# Patient Record
Sex: Female | Born: 2000 | Race: Black or African American | Hispanic: No | Marital: Single | Smoking: Never smoker
Health system: Southern US, Community
[De-identification: ages and names within clinical notes are randomized; demographics above are authoritative.]

---

## 2020-03-20 ENCOUNTER — Emergency Department (HOSPITAL_COMMUNITY): Payer: Medicaid Other

## 2020-03-20 ENCOUNTER — Encounter (HOSPITAL_COMMUNITY): Payer: Self-pay | Admitting: Emergency Medicine

## 2020-03-20 ENCOUNTER — Emergency Department (HOSPITAL_COMMUNITY)
Admission: EM | Admit: 2020-03-20 | Discharge: 2020-03-20 | Disposition: A | Discharge status: Home / Self Care | Payer: Medicaid Other | Attending: Emergency Medicine | Admitting: Emergency Medicine

## 2020-03-20 ENCOUNTER — Other Ambulatory Visit: Payer: Self-pay

## 2020-03-20 DIAGNOSIS — R110 Nausea: Secondary | ICD-10-CM | POA: Diagnosis not present

## 2020-03-20 DIAGNOSIS — R002 Palpitations: Secondary | ICD-10-CM | POA: Diagnosis not present

## 2020-03-20 DIAGNOSIS — F419 Anxiety disorder, unspecified: Secondary | ICD-10-CM | POA: Diagnosis present

## 2020-03-20 LAB — CBC WITH DIFFERENTIAL/PLATELET
Abs Immature Granulocytes: 0.02 10*3/uL (ref 0.00–0.07)
Basophils Absolute: 0 10*3/uL (ref 0.0–0.1)
Basophils Relative: 0 %
Eosinophils Absolute: 0.1 10*3/uL (ref 0.0–0.5)
Eosinophils Relative: 1 %
HCT: 35.7 % — ABNORMAL LOW (ref 36.0–46.0)
Hemoglobin: 12.5 g/dL (ref 12.0–15.0)
Immature Granulocytes: 0 %
Lymphocytes Relative: 23 %
Lymphs Abs: 2.1 10*3/uL (ref 0.7–4.0)
MCH: 29.1 pg (ref 26.0–34.0)
MCHC: 35 g/dL (ref 30.0–36.0)
MCV: 83 fL (ref 80.0–100.0)
Monocytes Absolute: 0.5 10*3/uL (ref 0.1–1.0)
Monocytes Relative: 5 %
Neutro Abs: 6.4 10*3/uL (ref 1.7–7.7)
Neutrophils Relative %: 71 %
Platelets: 269 10*3/uL (ref 150–400)
RBC: 4.3 MIL/uL (ref 3.87–5.11)
RDW: 12.4 % (ref 11.5–15.5)
WBC: 9.1 10*3/uL (ref 4.0–10.5)
nRBC: 0 % (ref 0.0–0.2)

## 2020-03-20 LAB — BASIC METABOLIC PANEL
Anion gap: 8 (ref 5–15)
BUN: 9 mg/dL (ref 6–20)
CO2: 23 mmol/L (ref 22–32)
Calcium: 9.1 mg/dL (ref 8.9–10.3)
Chloride: 104 mmol/L (ref 98–111)
Creatinine, Ser: 0.77 mg/dL (ref 0.44–1.00)
GFR, Estimated: >60 mL/min (ref >60)
Glucose, Bld: 110 mg/dL — ABNORMAL HIGH (ref 70–99)
Potassium: 3.6 mmol/L (ref 3.5–5.1)
Sodium: 135 mmol/L (ref 135–145)

## 2020-03-20 LAB — I-STAT BETA HCG BLOOD, ED (MC, WL, AP ONLY): I-stat hCG, quantitative: <5 m[IU]/mL (ref <5)

## 2020-03-20 MED ORDER — ONDANSETRON 4 MG PO TBDP: 4.0000 mg | ORAL_TABLET | Freq: Three times a day (TID) | ORAL | 0 refills | Status: AC | PRN

## 2020-03-20 MED ORDER — SODIUM CHLORIDE 0.9 % IV BOLUS
1000.0000 mL | Freq: Once | INTRAVENOUS | Status: AC
  Administered 2020-03-20: 1000 mL via INTRAVENOUS

## 2020-03-20 MED ORDER — ONDANSETRON HCL 4 MG/2ML IJ SOLN
4.0000 mg | Freq: Once | INTRAMUSCULAR | Status: AC
  Administered 2020-03-20: 4 mg via INTRAVENOUS
  Filled 2020-03-20: qty 2

## 2020-03-20 NOTE — ED Provider Notes (Signed)
[facility]-EMERGENCY DEPT Provider Note   CSN: [id number] Arrival date & time: 03/20/20  3086    History Chief Complaint  Patient presents with  . Anxiety  . Palpitations    Brittney Henry is a 19 y.o. female with no significant past medical history who presents for evaluation of anxiety and nausea.  Patient states multiple times in the middle of the night she has woken up with nausea,dry heaving and palpitations.  States when this occurs her palpitations will only last a few minutes when self resolve.  Patient states today she has had persistent nausea and dry heaving.  No associated abdominal pain. No NSAID use, EtOh Patient denies any family history of sudden cardiac death, arrhythmia, unexplained syncope.  Patient states she has not had any emesis however "I feel like I am going to throw up."  She denies any headache, lightheadedness, dizziness, chest pain, shortness of breath, cough, Covid exposure, abdominal pain, dysuria, diarrhea, constipation, pelvic pain, vaginal discharge, unilateral leg swelling, redness or warmth.  No recent surgery, mobilization.  No prior history of PE or DVT.  Patient states palpitations today lasted approximately 45 minutes and self resolved prior to EMS arrival.  She denies any illicit drug use, alcohol use.  Denies additional aggravating or alleviating factors.  Seen twice in 2020 and thought symptoms related to anxiety vs transient arrhythmia.  History obtained from patient and past medical records.  No interpreter used.  HPI     History reviewed. No pertinent past medical history.  There are no problems to display for this patient.   History reviewed   OB History   No obstetric history on file.     History reviewed. No pertinent family history.  Social History   Tobacco Use  . Smoking status: Never Smoker  . Smokeless tobacco: Never Used  Vaping Use  . Vaping Use: Never used  Substance Use Topics  . Alcohol use:  Never  . Drug use: Never    Home Medications Prior to Admission medications   Medication Sig Start Date End Date Taking? Authorizing Provider  ZAFEMY 150-35 MCG/24HR transdermal patch Place 1 patch onto the skin once a week.  03/03/20  Yes [provider]    Allergies    Patient has no known allergies.  Review of Systems   Review of Systems  Constitutional: Negative.   HENT: Negative.   Respiratory: Negative.   Cardiovascular: Positive for palpitations. Negative for chest pain and leg swelling.  Gastrointestinal: Positive for nausea. Negative for abdominal distention, abdominal pain, anal bleeding, blood in stool, constipation, diarrhea, rectal pain and vomiting.  Genitourinary: Negative.   Musculoskeletal: Negative.   Skin: Negative.   Neurological: Negative.   All other systems reviewed and are negative.   Physical Exam Updated Vital Signs BP 113/74   Pulse (!) 103   Temp 98.5 F (36.9 C) (Oral)   Resp 16   Ht 5\' 5"  (1.651 m)   Wt 56.7 kg   LMP 03/08/2020 (Exact Date)   SpO2 98%   BMI 20.80 kg/m   Physical Exam Vitals and nursing note reviewed.  Constitutional:      General: She is not in acute distress.    Appearance: She is well-developed. She is not ill-appearing, toxic-appearing or diaphoretic.  HENT:     Head: Normocephalic and atraumatic.     Nose: Nose normal.     Mouth/Throat:     Mouth: Mucous membranes are moist.  Eyes:     Pupils:  Pupils are equal, round, and reactive to light.  Cardiovascular:     Rate and Rhythm: Normal rate.     Pulses: Normal pulses.     Heart sounds: Normal heart sounds.     Comments: HR 96 in room Pulmonary:     Effort: Pulmonary effort is normal. No respiratory distress.     Breath sounds: Normal breath sounds. No stridor. No wheezing, rhonchi or rales.     Comments: Speaks in full sentences without difficulty.  Lungs clear to auscultation bilaterally. Chest:     Chest wall: No tenderness.  Abdominal:      General: Bowel sounds are normal. There is no distension.     Palpations: There is no mass.     Tenderness: There is no abdominal tenderness. There is no right CVA tenderness, left CVA tenderness, guarding or rebound.     Hernia: No hernia is present.  Musculoskeletal:        General: No swelling, tenderness, deformity or signs of injury. Normal range of motion.     Cervical back: Normal range of motion.     Right lower leg: No edema.     Left lower leg: No edema.     Comments: Moves all 4 extremities without difficulty.  Compartments soft.  Denna Haggard' sign negative  Skin:    General: Skin is warm and dry.     Capillary Refill: Capillary refill takes less than 2 seconds.  Neurological:     General: No focal deficit present.     Mental Status: She is alert and oriented to person, place, and time.    ED Results / Procedures / Treatments   Labs (all labs ordered are listed, but only abnormal results are displayed) Labs Reviewed  CBC WITH DIFFERENTIAL/PLATELET - Abnormal; Notable for the following components:      Result Value   HCT 35.7 (*)    All other components within normal limits  BASIC METABOLIC PANEL - Abnormal; Notable for the following components:   Glucose, Bld 110 (*)    All other components within normal limits  I-STAT BETA HCG BLOOD, ED ([facility], [facility], [facility] ONLY)    EKG EKG Interpretation  Date/Time:  Thursday March 20 2020 06:16:33 EDT Ventricular Rate:  97 PR Interval:    QRS Duration: 73 QT Interval:  342 QTC Calculation: 435 R Axis:   92 Text Interpretation: Sinus rhythm Right atrial enlargement Borderline right axis deviation No old tracing to compare Confirmed by Melene Plan [phone]) on 03/20/2020 6:27:18 AM   Radiology No results found.  Procedures Procedures (including critical care time)  Medications Ordered in ED Medications  sodium chloride 0.9 % bolus 1,000 mL (1,000 mLs Intravenous New Bag/Given 03/20/20 0551)  ondansetron (ZOFRAN) injection 4 mg (4 mg  Intravenous Given 03/20/20 0086)   ED Course  I have reviewed the triage vital signs and the nursing notes.  Pertinent labs & imaging results that were available during my care of the patient were reviewed by me and considered in my medical decision making (see chart for details).  19 year old female presents for evaluation of palpitations and nausea.  Afebrile, nonseptic, non-ill-appearing.  Has had intermittent yet reoccurring palpitations which have awoken her from sleep.  Patient states she feels anxious, nauseous and has dry heaves when this occurs.  Symptoms typically only last a few minutes and self resolved however today she has had persistent nausea dry heaving.  No associate abdominal pain, urinary complaints.  No chest pain, shortness of breath.  No unilateral leg swelling, redness or warmth.  No clinical evidence of DVT on exam.  She is Wells criteria low risk. Seen previously for similar complaints in 2020 with negative workup thought sx due to anxiety vs transient arrhythmia. Plan on symptom management, labs to check for electrolyte abnormality and reassess.  Labs and imaging personally reviewed and interpreted:  CBC without leukocytosis Preg negative BMP with mild hyperglycemia however without additional electrolyte, renal or liver abnormalities. EKG without ST changes, No Brugada, WPW    Care transferred to [facility], PA-C who will follow up on chest xray, PO challenge and determine disposition.    MDM Rules/Calculators/A&P                           Final Clinical Impression(s) / ED Diagnoses Final diagnoses:  Palpitations  Nausea    Rx / DC Orders ED Discharge Orders    None       Jacoya Bauman A, PA-C 03/20/20 0539    Melene Plan, DO 03/20/20 7673

## 2020-03-20 NOTE — ED Triage Notes (Signed)
Pt presents via EMS from her [employer], reports palpitations that woke her up, states this has happened twice before, denies any medical hx

## 2020-03-20 NOTE — ED Provider Notes (Signed)
7:13 AM signout from [place] PA-C at shift change.  Patient is tolerating p.o. fluids and is feeling better.  She has had water in the room.  Nausea improved.  Chest x-ray was negative.  Reiterated to patient that her lab work is reassuring.  Plan to discharged home with Zofran.  Encourage PCP follow-up, rest, hydration.  Patient urged to return with worsening symptoms or other concerns. Patient verbalized understanding and agrees with plan.   BP 113/74   Pulse (!) 103   Temp 98.5 F (36.9 C) (Oral)   Resp 16   Ht 5\' 5"  (1.651 m)   Wt 56.7 kg   LMP 03/08/2020 (Exact Date)   SpO2 98%   BMI 20.80 kg/m     03/10/2020, PA-C 03/20/20 13/04/21    [id number], MD 03/21/20 1024

## 2020-03-20 NOTE — Discharge Instructions (Signed)
Please read and follow all provided instructions.  Your diagnoses today include:  1. Palpitations   2. Nausea     Tests performed today include:  An EKG of your heart - no abnormal heart beats or other problems  A chest x-ray  Blood counts and electrolytes  Pregnancy test - negative  Vital signs. See below for your results today.   Medications prescribed:   Zofran (ondansetron) - for nausea and vomiting  Take any prescribed medications only as directed.  Follow-up instructions: Please follow-up with your primary care provider as soon as you can for further evaluation of your symptoms.   Return instructions:  SEEK IMMEDIATE MEDICAL ATTENTION IF:  You have severe chest pain  You wake from sleep with chest pain or shortness of breath.  You feel dizzy or faint.  You have chest pain not typical of your usual pain for which you originally saw your caregiver.   You have any other emergent concerns regarding your health.  Your vital signs today were: BP 113/74    Pulse (!) 103    Temp 98.5 F (36.9 C) (Oral)    Resp 16    Ht 5\' 5"  (1.651 m)    Wt 56.7 kg    LMP 03/08/2020 (Exact Date)    SpO2 98%    BMI 20.80 kg/m  If your blood pressure (BP) was elevated above 135/85 this visit, please have this repeated by your doctor within one month. --------------

## 2020-03-20 NOTE — ED Notes (Signed)
Pt given water 

## 2020-03-20 NOTE — ED Triage Notes (Deleted)
Patient is complaining of left ear pain. Patient states that she has been dealing with ear pain for over a month.  

## 2022-01-01 IMAGING — DX DG CHEST 1V PORT
2 series · 2 of 2 positions shown · non-contrast
Comparison: No prior.

CLINICAL DATA: Palpitations.  Shortness of breath.

EXAM:
PORTABLE CHEST 1 VIEW

[chest ap (1 of 2)]
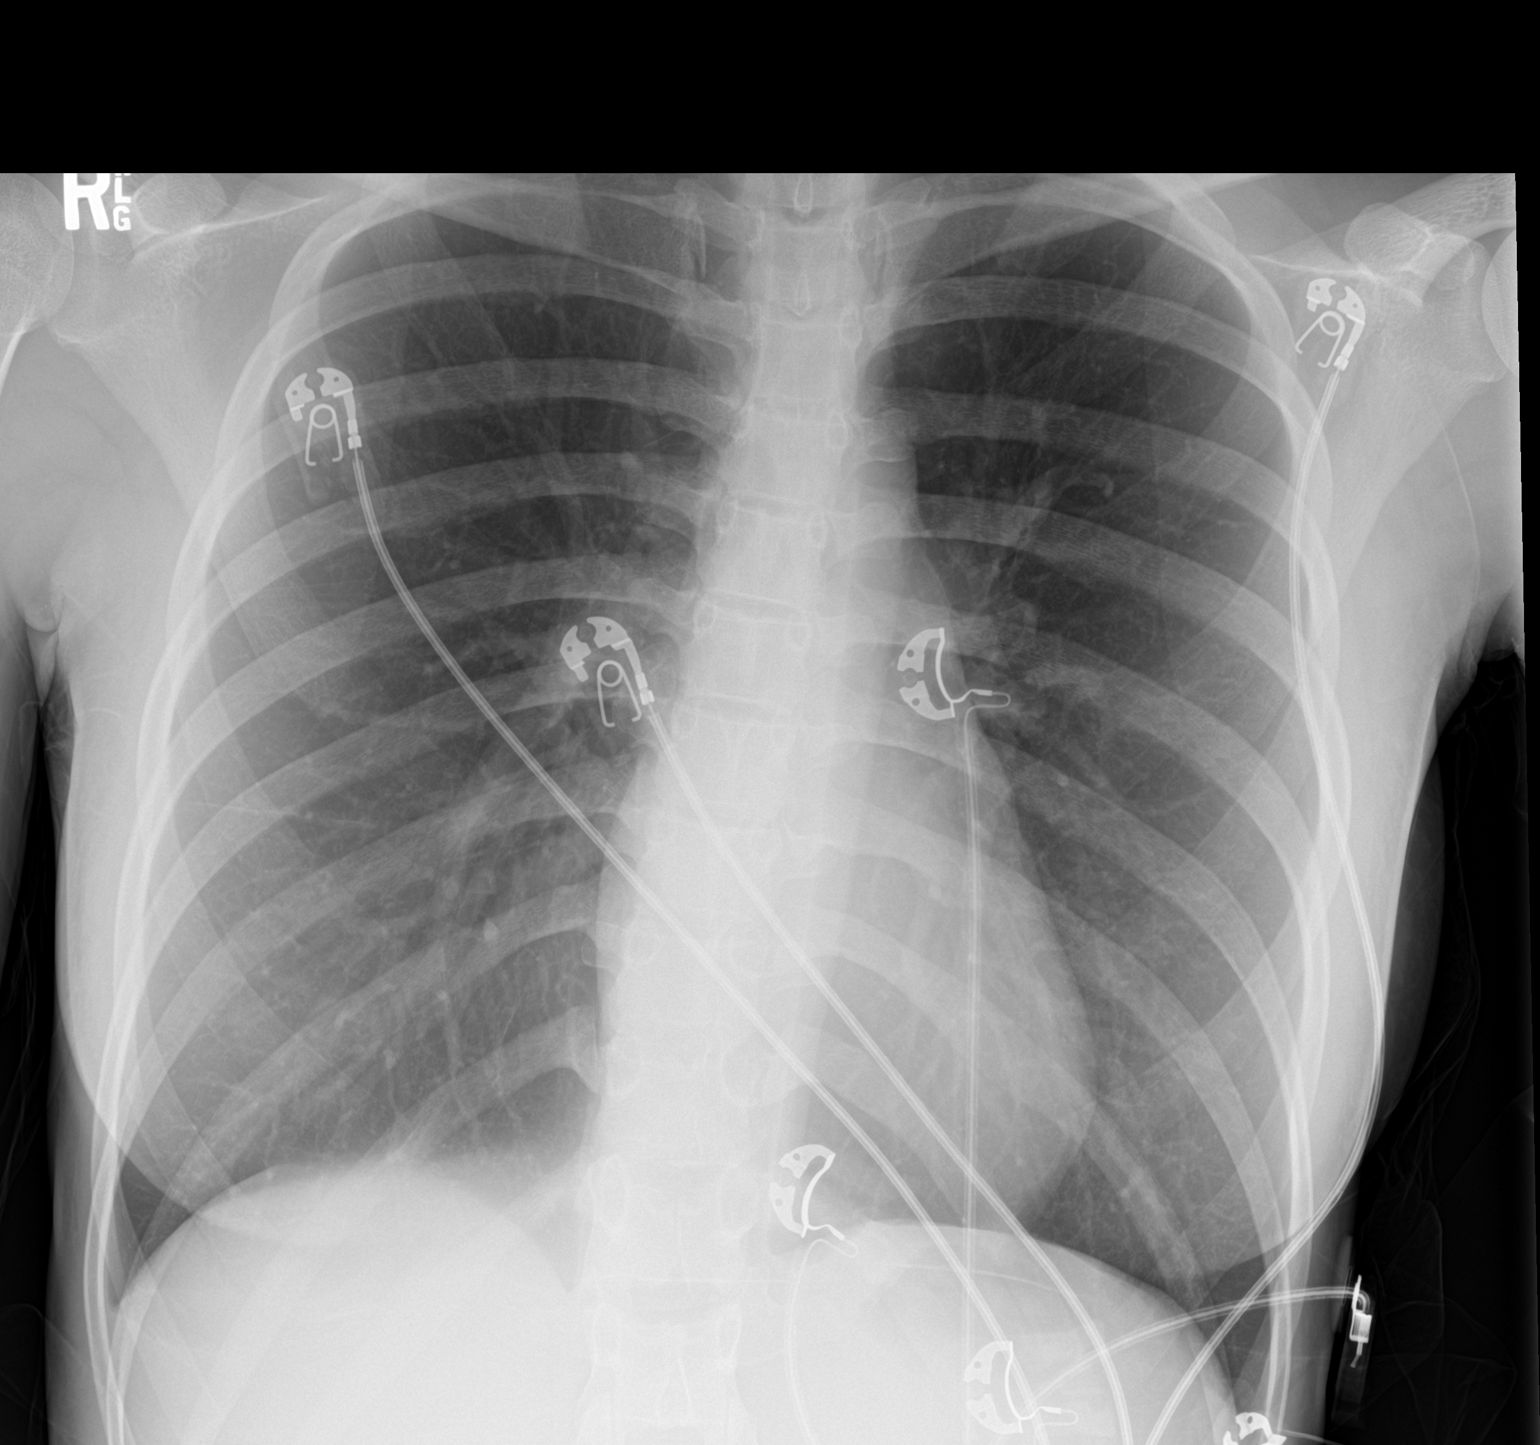

[chest ap (2 of 2)]
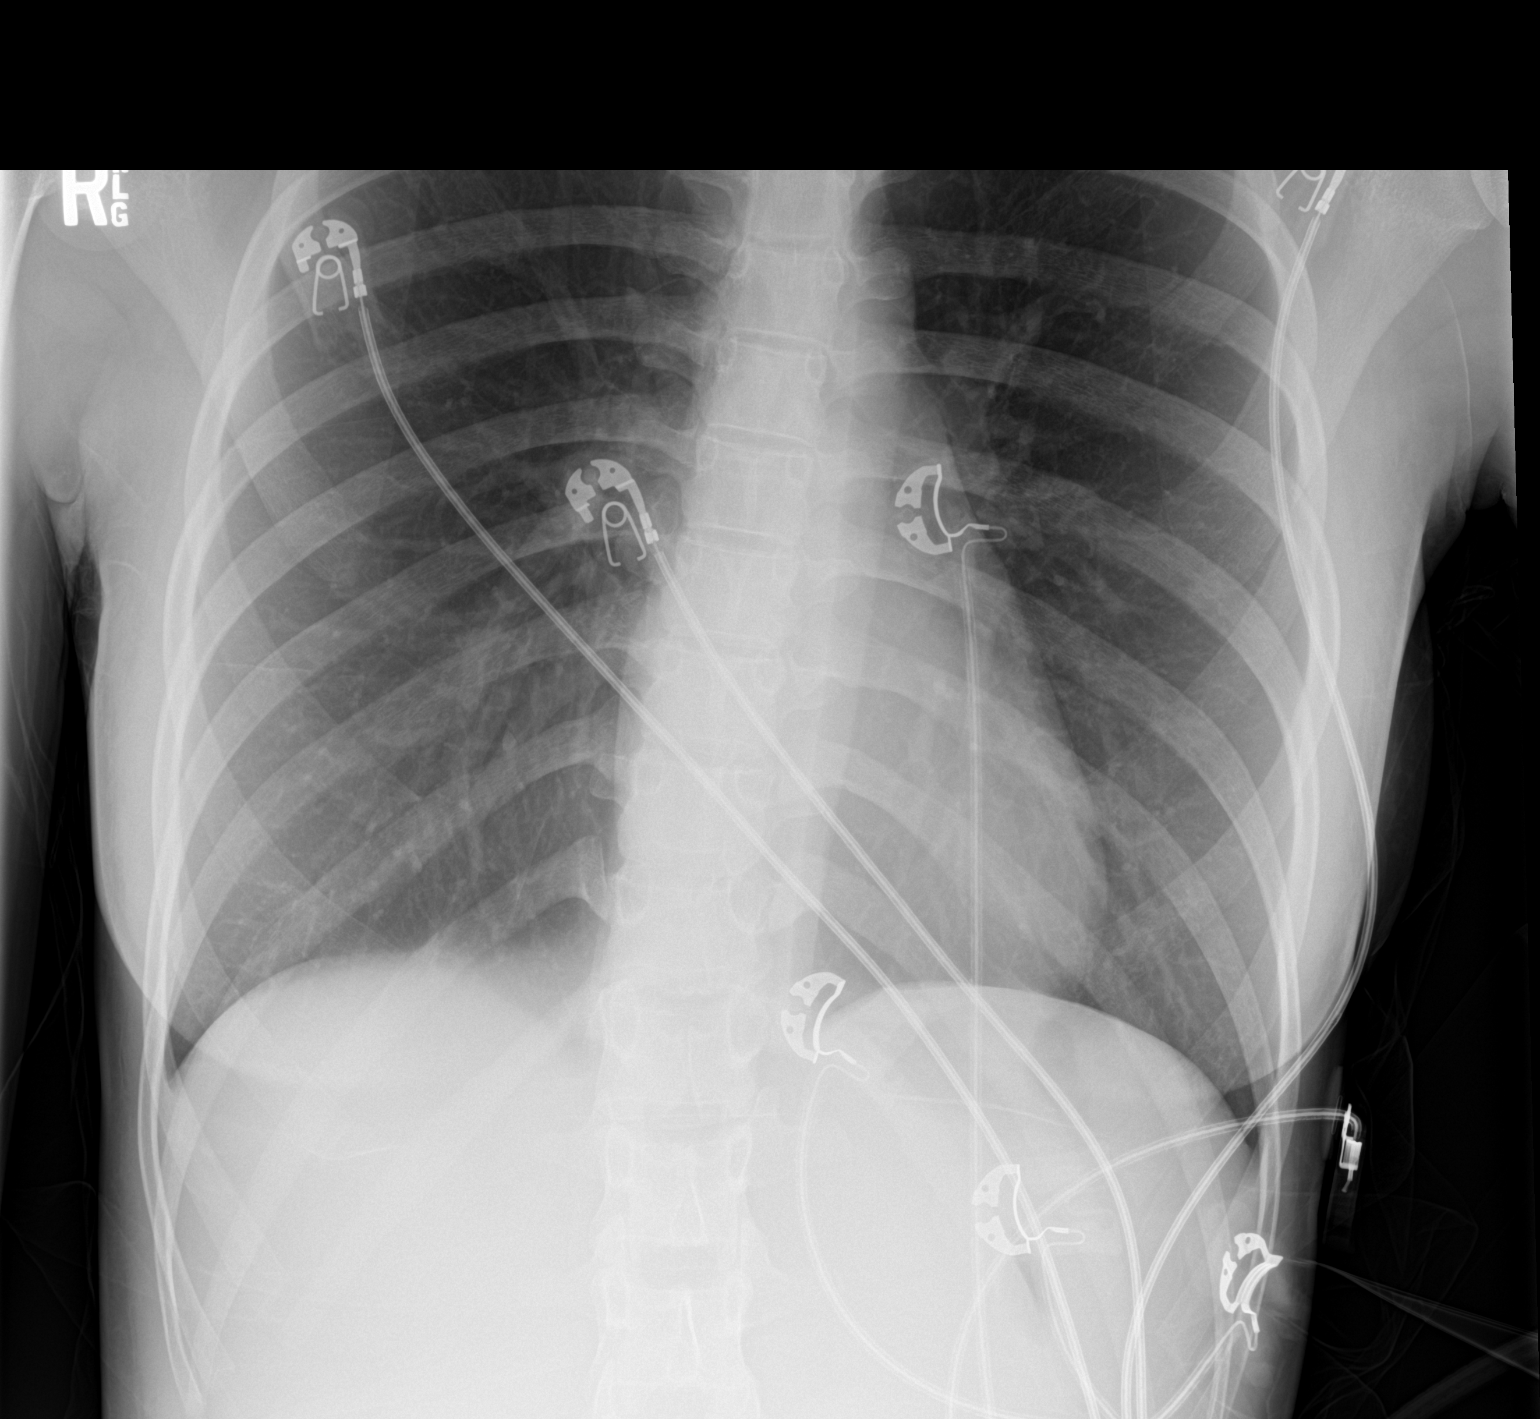

[2 of 2 positions shown; findings below may reference images not displayed]

FINDINGS: Mediastinum hilar structures normal. Lungs are clear. No pleural
effusion or pneumothorax. Heart size normal. Scoliosis thoracic
spine.
IMPRESSION: No acute cardiopulmonary disease.

## 2022-04-11 ENCOUNTER — Ambulatory Visit
Admission: EM | Admit: 2022-04-11 | Discharge: 2022-04-11 | Disposition: A | Discharge status: Home / Self Care | Payer: Medicaid Other | Attending: Urgent Care | Admitting: Urgent Care

## 2022-04-11 DIAGNOSIS — U071 COVID-19: Secondary | ICD-10-CM | POA: Insufficient documentation

## 2022-04-11 DIAGNOSIS — R52 Pain, unspecified: Secondary | ICD-10-CM

## 2022-04-11 DIAGNOSIS — R070 Pain in throat: Secondary | ICD-10-CM | POA: Insufficient documentation

## 2022-04-11 DIAGNOSIS — B349 Viral infection, unspecified: Secondary | ICD-10-CM

## 2022-04-11 LAB — RESP PANEL BY RT-PCR (FLU A&B, COVID) ARPGX2
Influenza A by PCR: NEGATIVE
Influenza B by PCR: NEGATIVE
SARS Coronavirus 2 by RT PCR: POSITIVE — AB

## 2022-04-11 LAB — POCT RAPID STREP A (OFFICE): Rapid Strep A Screen: NEGATIVE

## 2022-04-11 MED ORDER — PSEUDOEPHEDRINE HCL 60 MG PO TABS: 60.0000 mg | ORAL_TABLET | Freq: Three times a day (TID) | ORAL | 0 refills | Status: AC | PRN

## 2022-04-11 MED ORDER — CETIRIZINE HCL 10 MG PO TABS: 10.0000 mg | ORAL_TABLET | Freq: Every day | ORAL | 0 refills | Status: AC

## 2022-04-11 MED ORDER — PROMETHAZINE-DM 6.25-15 MG/5ML PO SYRP: 5.0000 mL | ORAL_SOLUTION | Freq: Three times a day (TID) | ORAL | 0 refills | Status: AC | PRN

## 2022-04-11 NOTE — Discharge Instructions (Addendum)

## 2022-04-11 NOTE — ED Triage Notes (Addendum)
Pt c/o body aches, chills, HA, sore throat x 4 days-requesting strep and covid tests-NAD-steady gait

## 2022-04-11 NOTE — ED Provider Notes (Signed)
[facility] - [facility]  Note:  This document was prepared using Conservation officer, historic buildings and may include unintentional dictation errors.  MRN: [id number] DOB: [birth year]  Subjective:   Brittney Henry is a 21 y.o. female presenting for 4-day history of acute onset persistent body pains, chills, headache, throat pain, coughing.  Patient would like a strep and COVID test.  Has no suspicion for STI of the oropharynx.  No history of asthma.  No smoking, vaping, marijuana use.  No current facility-administered medications for this encounter.  Current Outpatient Medications:    ondansetron (ZOFRAN ODT) 4 MG disintegrating tablet, Take 1 tablet (4 mg total) by mouth every 8 (eight) hours as needed for nausea or vomiting., Disp: 10 tablet, Rfl: 0   ZAFEMY 150-35 MCG/24HR transdermal patch, Place 1 patch onto the skin once a week. , Disp: , Rfl:    No Known Allergies  History reviewed. No pertinent past medical history.   History reviewed. No pertinent surgical history.  Family History  Problem Relation Age of Onset   Hypertension Mother     Social History   Tobacco Use   Smoking status: Never   Smokeless tobacco: Never  Vaping Use   Vaping Use: Never used  Substance Use Topics   Alcohol use: Never   Drug use: Never    ROS   Objective:   Vitals: BP (!) 146/94 (BP Location: Right Arm)   Pulse 93   Temp 98.7 F (37.1 C) (Oral)   Resp 16   SpO2 98%   Physical Exam Constitutional:      General: She is not in acute distress.    Appearance: Normal appearance. She is well-developed and normal weight. She is not ill-appearing, toxic-appearing or diaphoretic.  HENT:     Head: Normocephalic and atraumatic.     Right Ear: Tympanic membrane, ear canal and external ear normal. No drainage or tenderness. No middle ear effusion. There is no impacted cerumen. Tympanic membrane is not erythematous or bulging.     Left Ear: Tympanic membrane, ear canal and  external ear normal. No drainage or tenderness.  No middle ear effusion. There is no impacted cerumen. Tympanic membrane is not erythematous or bulging.     Nose: Congestion present. No rhinorrhea.     Mouth/Throat:     Mouth: Mucous membranes are moist. No oral lesions.     Pharynx: Posterior oropharyngeal erythema present. No pharyngeal swelling, oropharyngeal exudate or uvula swelling.     Tonsils: No tonsillar exudate or tonsillar abscesses.  Eyes:     General: No scleral icterus.       Right eye: No discharge.        Left eye: No discharge.     Extraocular Movements: Extraocular movements intact.     Right eye: Normal extraocular motion.     Left eye: Normal extraocular motion.     Conjunctiva/sclera: Conjunctivae normal.  Cardiovascular:     Rate and Rhythm: Normal rate and regular rhythm.     Heart sounds: Normal heart sounds. No murmur heard.    No friction rub. No gallop.  Pulmonary:     Effort: Pulmonary effort is normal. No respiratory distress.     Breath sounds: No stridor. No wheezing, rhonchi or rales.  Chest:     Chest wall: No tenderness.  Musculoskeletal:     Cervical back: Normal range of motion and neck supple.  Lymphadenopathy:     Cervical: No cervical adenopathy.  Skin:  General: Skin is warm and dry.  Neurological:     General: No focal deficit present.     Mental Status: She is alert and oriented to person, place, and time.  Psychiatric:        Mood and Affect: Mood normal.        Behavior: Behavior normal.     Results for orders placed or performed during the hospital encounter of 04/11/22 (from the past 24 hour(s))  POCT rapid strep A     Status: None   Collection Time: 04/11/22 12:00 PM  Result Value Ref Range   Rapid Strep A Screen Negative Negative    Assessment and Plan :   PDMP not reviewed this encounter.  1. Acute viral syndrome   2. Body aches   3. Throat pain     Deferred imaging given clear cardiopulmonary exam,  hemodynamically stable vital signs. Will manage for viral illness such as viral URI, viral syndrome, viral rhinitis, COVID-19, viral pharyngitis. Recommended supportive care. Offered scripts for symptomatic relief. COVID 19, flu and strep culture are pending. Counseled patient on potential for adverse effects with medications prescribed/recommended today, ER and return-to-clinic precautions discussed, patient verbalized understanding.     Jaynee Eagles, PA-C 04/11/22 1218

## 2022-04-14 LAB — CULTURE, GROUP A STREP (THRC)
